# Patient Record
Sex: Female | Born: 1970 | Race: Black or African American | Hispanic: No | Marital: Single | Smoking: Never smoker
Health system: Southern US, Community
[De-identification: ages and names within clinical notes are randomized; demographics above are authoritative.]

## PROBLEM LIST (undated history)

## (undated) DIAGNOSIS — G932 Benign intracranial hypertension: Secondary | ICD-10-CM

## (undated) HISTORY — DX: Benign intracranial hypertension: G93.2

---

## 2002-09-30 ENCOUNTER — Ambulatory Visit (HOSPITAL_COMMUNITY): Admission: RE | Admit: 2002-09-30 | Discharge: 2002-09-30 | Payer: Self-pay | Admitting: *Deleted

## 2002-09-30 ENCOUNTER — Encounter: Payer: Self-pay | Admitting: *Deleted

## 2020-04-06 ENCOUNTER — Other Ambulatory Visit: Payer: Self-pay | Admitting: Chiropractor

## 2020-04-06 ENCOUNTER — Ambulatory Visit
Admission: RE | Admit: 2020-04-06 | Discharge: 2020-04-06 | Disposition: A | Discharge status: Home / Self Care | Payer: No Typology Code available for payment source | Source: Ambulatory Visit | Attending: Chiropractor | Admitting: Chiropractor

## 2020-04-06 DIAGNOSIS — S233XXA Sprain of ligaments of thoracic spine, initial encounter: Secondary | ICD-10-CM | POA: Insufficient documentation

## 2020-04-06 DIAGNOSIS — M75102 Unspecified rotator cuff tear or rupture of left shoulder, not specified as traumatic: Secondary | ICD-10-CM | POA: Insufficient documentation

## 2020-04-06 DIAGNOSIS — S134XXA Sprain of ligaments of cervical spine, initial encounter: Secondary | ICD-10-CM | POA: Diagnosis not present

## 2020-04-22 ENCOUNTER — Emergency Department: Payer: No Typology Code available for payment source

## 2020-04-22 ENCOUNTER — Emergency Department
Admission: EM | Admit: 2020-04-22 | Discharge: 2020-04-22 | Disposition: A | Discharge status: Home / Self Care | Payer: No Typology Code available for payment source | Attending: Emergency Medicine | Admitting: Emergency Medicine

## 2020-04-22 ENCOUNTER — Encounter: Payer: Self-pay | Admitting: Intensive Care

## 2020-04-22 ENCOUNTER — Other Ambulatory Visit: Payer: Self-pay

## 2020-04-22 DIAGNOSIS — M542 Cervicalgia: Secondary | ICD-10-CM | POA: Insufficient documentation

## 2020-04-22 DIAGNOSIS — R202 Paresthesia of skin: Secondary | ICD-10-CM | POA: Insufficient documentation

## 2020-04-22 DIAGNOSIS — Z041 Encounter for examination and observation following transport accident: Secondary | ICD-10-CM | POA: Insufficient documentation

## 2020-04-22 DIAGNOSIS — M25512 Pain in left shoulder: Secondary | ICD-10-CM | POA: Diagnosis not present

## 2020-04-22 DIAGNOSIS — Y92410 Unspecified street and highway as the place of occurrence of the external cause: Secondary | ICD-10-CM | POA: Insufficient documentation

## 2020-04-22 DIAGNOSIS — Y999 Unspecified external cause status: Secondary | ICD-10-CM | POA: Diagnosis not present

## 2020-04-22 DIAGNOSIS — Y9389 Activity, other specified: Secondary | ICD-10-CM | POA: Insufficient documentation

## 2020-04-22 DIAGNOSIS — R519 Headache, unspecified: Secondary | ICD-10-CM | POA: Insufficient documentation

## 2020-04-22 DIAGNOSIS — G44311 Acute post-traumatic headache, intractable: Secondary | ICD-10-CM

## 2020-04-22 DIAGNOSIS — I10 Essential (primary) hypertension: Secondary | ICD-10-CM | POA: Insufficient documentation

## 2020-04-22 MED ORDER — CYCLOBENZAPRINE HCL 5 MG PO TABS: 5.0000 mg | ORAL_TABLET | Freq: Three times a day (TID) | ORAL | 0 refills | Status: AC | PRN

## 2020-04-22 MED ORDER — PREDNISONE 10 MG PO TABS: ORAL_TABLET | ORAL | 0 refills | Status: AC
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
